# Patient Record
Sex: Male | Born: 1945 | Race: White | Hispanic: No | Smoking: Never smoker
Health system: Southern US, Community
[De-identification: ages and names within clinical notes are randomized; demographics above are authoritative.]

## PROBLEM LIST (undated history)

## (undated) DIAGNOSIS — E119 Type 2 diabetes mellitus without complications: Secondary | ICD-10-CM

## (undated) DIAGNOSIS — E7800 Pure hypercholesterolemia, unspecified: Secondary | ICD-10-CM

## (undated) DIAGNOSIS — I10 Essential (primary) hypertension: Secondary | ICD-10-CM

---

## 2010-10-10 ENCOUNTER — Ambulatory Visit: Payer: Self-pay | Admitting: Family Medicine

## 2011-02-17 ENCOUNTER — Ambulatory Visit: Payer: Self-pay | Admitting: Family Medicine

## 2012-11-27 ENCOUNTER — Ambulatory Visit: Payer: Self-pay | Admitting: Family Medicine

## 2015-02-26 ENCOUNTER — Encounter: Payer: Self-pay | Admitting: Emergency Medicine

## 2015-02-26 ENCOUNTER — Ambulatory Visit: Payer: BLUE CROSS/BLUE SHIELD

## 2015-02-26 ENCOUNTER — Ambulatory Visit
Admission: EM | Admit: 2015-02-26 | Discharge: 2015-02-26 | Disposition: A | Discharge status: Home / Self Care | Payer: BLUE CROSS/BLUE SHIELD | Attending: Family Medicine | Admitting: Family Medicine

## 2015-02-26 DIAGNOSIS — M2341 Loose body in knee, right knee: Secondary | ICD-10-CM

## 2015-02-26 DIAGNOSIS — M1711 Unilateral primary osteoarthritis, right knee: Secondary | ICD-10-CM | POA: Diagnosis not present

## 2015-02-26 DIAGNOSIS — I10 Essential (primary) hypertension: Secondary | ICD-10-CM | POA: Insufficient documentation

## 2015-02-26 DIAGNOSIS — M25761 Osteophyte, right knee: Secondary | ICD-10-CM | POA: Diagnosis not present

## 2015-02-26 DIAGNOSIS — E119 Type 2 diabetes mellitus without complications: Secondary | ICD-10-CM | POA: Insufficient documentation

## 2015-02-26 DIAGNOSIS — M25561 Pain in right knee: Secondary | ICD-10-CM | POA: Diagnosis present

## 2015-02-26 HISTORY — DX: Type 2 diabetes mellitus without complications: E11.9

## 2015-02-26 HISTORY — DX: Essential (primary) hypertension: I10

## 2015-02-26 MED ORDER — MELOXICAM 7.5 MG PO TABS: 7.5000 mg | ORAL_TABLET | Freq: Every day | ORAL | Status: DC

## 2015-02-26 NOTE — ED Provider Notes (Signed)
CSN: [id number]     Arrival date & time 02/26/15  1745 History   First MD Initiated Contact with Patient 02/26/15 1756     Chief Complaint  Patient presents with  . Leg Pain   (Consider location/radiation/quality/duration/timing/severity/associated sxs/prior Treatment) HPI  This a 69 year old male presents with a 2-3 month history of right knee pain he states will radiate into his anterior and into his dorsum foot. He states that happens with any motion with activity. This will also keep him up but late at night. When he first arises from a sitting or lying position and walks he limps for quite some time until his leg warms up" and then he can go about the day. He denies any back pain. He does not remember having any swelling in his knee. His had no injury to his knee.  Past Medical History  Diagnosis Date  . Diabetes mellitus without complication   . Hypertension    History reviewed. No pertinent past surgical history. Family History  Problem Relation Age of Onset  . Multiple sclerosis Mother   . Cerebrovascular Accident Mother   . Hypertension Father    Social History  Substance Use Topics  . Smoking status: Never Smoker   . Smokeless tobacco: None  . Alcohol Use: No    Review of Systems  Constitutional: Positive for activity change.  Musculoskeletal: Positive for arthralgias.  All other systems reviewed and are negative.   Allergies  Review of patient's allergies indicates no known allergies.  Home Medications   Prior to Admission medications   Medication Sig Start Date End Date Taking? Authorizing Provider  lisinopril-hydrochlorothiazide (PRINZIDE,ZESTORETIC) 20-12.5 MG per tablet Take 1 tablet by mouth daily.   Yes Historical Provider, MD  lovastatin (MEVACOR) 20 MG tablet Take 20 mg by mouth at bedtime.   Yes Historical Provider, MD  metFORMIN (GLUMETZA) 1000 MG (MOD) 24 hr tablet Take 1,000 mg by mouth daily with breakfast.   Yes Historical Provider, MD  meloxicam  (MOBIC) 7.5 MG tablet Take 1 tablet (7.5 mg total) by mouth daily. 02/26/15   Chrissie Noa Roemer, PA-C   BP 150/70 mmHg  Pulse 68  Temp(Src) 97.9 F (36.6 C) (Tympanic)  Resp 18  Ht  (1.651 m)  Wt 230 lb (104.327 kg)  BMI 38.27 kg/m2  SpO2 98% Physical Exam  Constitutional: He is oriented to person, place, and time. He appears well-developed and well-nourished.  HENT:  Head: Normocephalic and atraumatic.  Eyes: Pupils are equal, round, and reactive to light.  Musculoskeletal: He exhibits tenderness. He exhibits no edema.  Examination of the right knee is a very small joint effusion. He has difficulty attaining full extension and lacks approximately 5-10. Range of motion the sitting position is comfortable. There is no collateral ligament laxity or tenderness. He does have some mild joint line tenderness on the medial joint line. He does have some tenderness over the anterior tib particularly distally. Is no retropatellar tenderness and a negative grind test. He has a negative drawer sign.  Neurological: He is alert and oriented to person, place, and time.  Skin: Skin is dry.  Psychiatric: He has a normal mood and affect. His behavior is normal. Judgment and thought content normal.  Nursing note and vitals reviewed.   ED Course  Procedures (including critical care time) Labs Review Labs Reviewed - No data to display  Imaging Review Dg Knee Complete 4 Views Right  02/26/2015   CLINICAL DATA:  Pain in the right knee  from the anterior patella down to the foot. Pain with and without weight-bearing. No known injury appear  EXAM: RIGHT KNEE - COMPLETE 4+ VIEW  COMPARISON:  None.  FINDINGS: Degenerative changes in the right knee with medial compartment narrowing and mild tricompartmental osteophytosis. There is a small osseous fragment in the anterior midline joint space which may indicate a loose body. Donor site is not visualized. No significant effusion. No evidence of acute fracture or  dislocation.  IMPRESSION: Degenerative changes in the right knee. Possible anterior loose body.   Electronically Signed   By: Burman Nieves M.D.   On: 02/26/2015 18:36     MDM   1. Primary osteoarthritis of right knee   2. Loose body of knee, right    New Prescriptions   MELOXICAM (MOBIC) 7.5 MG TABLET    Take 1 tablet (7.5 mg total) by mouth daily.  Plan: 1. Test/x-ray results and diagnosis reviewed with patient 2. rx as per orders; risks, benefits, potential side effects reviewed with patient 3. Recommend supportive treatment with cane/crutch. Heat  4. F/u orthopaedics. I discussed the patient's x-ray results and my findings. Told him that he does have appears as a loose body in his joint along with some degenerative arthritis. I recommended he see an orthopedist for further evaluation and possible arthroscopy/total knee at his discretion. In the meantime I recommended he use crutches or a cane to help unload the joint and is also prescribed some Mobic 7.5 mg he'll take on a daily basis. Use Heat whenever the joint is very painful. Provide him with a disc of his x-rays.  Lutricia Feil, PA-C 02/26/15 1907

## 2015-02-26 NOTE — Discharge Instructions (Signed)

## 2015-02-26 NOTE — ED Notes (Signed)
Pt with right leg pain x 2 months

## 2015-03-12 DIAGNOSIS — M1711 Unilateral primary osteoarthritis, right knee: Secondary | ICD-10-CM | POA: Diagnosis not present

## 2016-06-01 ENCOUNTER — Ambulatory Visit
Admission: EM | Admit: 2016-06-01 | Discharge: 2016-06-01 | Disposition: A | Discharge status: Home / Self Care | Payer: BLUE CROSS/BLUE SHIELD | Attending: Emergency Medicine | Admitting: Emergency Medicine

## 2016-06-01 ENCOUNTER — Ambulatory Visit (INDEPENDENT_AMBULATORY_CARE_PROVIDER_SITE_OTHER): Payer: BLUE CROSS/BLUE SHIELD

## 2016-06-01 DIAGNOSIS — M25562 Pain in left knee: Secondary | ICD-10-CM

## 2016-06-01 DIAGNOSIS — R21 Rash and other nonspecific skin eruption: Secondary | ICD-10-CM

## 2016-06-01 MED ORDER — IBUPROFEN 600 MG PO TABS: 600.0000 mg | ORAL_TABLET | Freq: Four times a day (QID) | ORAL | 0 refills | Status: DC | PRN

## 2016-06-01 MED ORDER — TRAMADOL HCL 50 MG PO TABS: ORAL_TABLET | ORAL | 0 refills | Status: AC

## 2016-06-01 MED ORDER — MELOXICAM 7.5 MG PO TABS: 7.5000 mg | ORAL_TABLET | Freq: Every day | ORAL | 0 refills | Status: AC

## 2016-06-01 MED ORDER — TRIAMCINOLONE ACETONIDE 0.1 % EX CREA: 1.0000 "application " | TOPICAL_CREAM | Freq: Two times a day (BID) | CUTANEOUS | 0 refills | Status: AC

## 2016-06-01 NOTE — ED Triage Notes (Signed)
Pt c/o left knee pain, he was working today and noticed it was snapping and popping. He purchased a knee brace and it helped some but it still hurts when he put pressure on it.

## 2016-06-01 NOTE — ED Provider Notes (Signed)
HPI  SUBJECTIVE:  Darrell Lowe is a 70 y.o. male who presents with Diffuse left knee pain for the past 2 days. He reports popping or cracking while walking today. States that the pain is constant. He tried a knee brace with improvement in symptoms. Symptoms worse with walking, implying torque. He denies erythema, swelling, fevers, numbness, tingling, weakness, no fall, trauma or changes physical activity. He walks over 4 miles a day and is on his knees a lot at his job at [employer]. He also reports an itchy rash for the past 2-3 months. He has a past medical history diabetes, hypertension. No history of left knee injury, GI bleed, kidney disease. PMD: [facility]    Past Medical History:  Diagnosis Date  . Diabetes mellitus without complication ([facility])   . Hypertension     History reviewed. No pertinent surgical history.  Family History  Problem Relation Age of Onset  . Multiple sclerosis Mother   . Cerebrovascular Accident Mother   . Hypertension Father     Social History  Substance Use Topics  . Smoking status: Never Smoker  . Smokeless tobacco: Never Used  . Alcohol use No    No current facility-administered medications for this encounter.   Current Outpatient Prescriptions:  .  lisinopril-hydrochlorothiazide (PRINZIDE,ZESTORETIC) 20-12.5 MG per tablet, Take 1 tablet by mouth daily., Disp: , Rfl:  .  lovastatin (MEVACOR) 20 MG tablet, Take 20 mg by mouth at bedtime., Disp: , Rfl:  .  metFORMIN (GLUMETZA) 1000 MG (MOD) 24 hr tablet, Take 1,000 mg by mouth daily with breakfast., Disp: , Rfl:  .  meloxicam (MOBIC) 7.5 MG tablet, Take 1 tablet (7.5 mg total) by mouth daily., Disp: 15 tablet, Rfl: 0 .  traMADol (ULTRAM) 50 MG tablet, 1-2 tabs po q 6 hr prn pain Maximum dose= 8 tablets per day, Disp: 20 tablet, Rfl: 0 .  triamcinolone cream (KENALOG) 0.1 %, Apply 1 application topically 2 (two) times daily. Apply for 2 weeks. May use on face, Disp: 30 g, Rfl:  0  No Known Allergies   ROS  As noted in HPI.   Physical Exam  BP 119/62 (BP Location: Left Arm)   Pulse 67   Temp 98 F (36.7 C) (Oral)   Resp 18   Ht 5\' 5"  (1.651 m)   Wt 229 lb (103.9 kg)   SpO2 98%   BMI 38.11 kg/m   Constitutional: Well developed, well nourished, no acute distress Eyes:  EOMI, conjunctiva normal bilaterally HENT: Normocephalic, atraumatic,mucus membranes moist Respiratory: Normal inspiratory effort Cardiovascular: Normal rate GI: nondistended Skin: excoriations on his  upper extremities. Dry, chapped skin  Musculoskeletal: L Knee ROM baseline for PT , Flexion  intact, Patella NT, Patellar tendon NT, Medial joint minimally tender, Lateral joint NT, Popliteal region NT, Lachman's stable, Varus LCL stress testing stable, Valgus MCL stress testing stable, McMurray's testing normal but with some crepitus, distal NVI with intact baseline sensation / motor / pulse distal to knee affected extremity. No effusion. No erythema. No increased temperature. No popliteal tenderness. Neurologic: Alert & oriented x 3, no focal neuro deficits Psychiatric: Speech and behavior appropriate   ED Course   Medications - No data to display  Orders Placed This Encounter  Procedures  . DG Knee Complete 4 Views Left    Standing Status:   Standing    Number of Occurrences:   1    Order Specific Question:   Reason for Exam (SYMPTOM  OR  DIAGNOSIS REQUIRED)    Answer:   Pain    No results found for this or any previous visit (from the past 24 hour(s)). Dg Knee Complete 4 Views Left  Result Date: 06/01/2016 CLINICAL DATA:  Knee pain for several weeks.  No known injury. EXAM: LEFT KNEE - COMPLETE 4+ VIEW COMPARISON:  None FINDINGS: Spurring is noted of the tibial spine and patella. There is slight joint space narrowing of medial femorotibial compartment. No acute fracture, intra-articular loose body nor bone destruction. No significant joint effusion. Soft tissue varicosities  are noted along the medial aspect of the thigh, knee and calf. IMPRESSION: Osteophytes with medial femorotibial joint space narrowing consistent with osteoarthritis. Kellgren Lawrence Grade:  2 *grade 0: no radiographic features of OA are present *grade 1: doubtful joint space narrowing (JSN) and possible osteophytic lipping *grade 2: definite osteophytes and possible JSN on anteroposterior weight-bearing radiograph *grade 3: multiple osteophytes, definite JSN, sclerosis, possible bony deformity *grade 4: large osteophytes, marked JSN, severe sclerosis and definite bony deformity Electronically Signed   By: Tollie Ethavid  Kwon M.D.   On: 06/01/2016 17:43    ED Clinical Impression  Acute pain of left knee  Rash   ED Assessment/Plan  Reviewed  imaging independently. Osteophytes with some medial osteoarthritis. See radiology report for details   Knee: May be meniscal wear/tear no gout, infection. Home with meloxicam 7.5 mg per patient last tramadol, knee brace, ice, rest, follow-up with Dr. Lesleigh NoeKosinski, orthopedic surgeon on call in a week to 10 days if no better with conservative treatment.   Rash: May be eczema. We'll send home with Zyrtec, and Eucerin/Goldbond, triamcinolone cream as needed for itching.  Discussed imaging, MDM, plan and followup with patient. Discussed sn/sx that should prompt return to the ED. Patient agrees with plan.   Meds ordered this encounter  Medications  . DISCONTD: ibuprofen (ADVIL,MOTRIN) 600 MG tablet    Sig: Take 1 tablet (600 mg total) by mouth every 6 (six) hours as needed.    Dispense:  30 tablet    Refill:  0  . traMADol (ULTRAM) 50 MG tablet    Sig: 1-2 tabs po q 6 hr prn pain Maximum dose= 8 tablets per day    Dispense:  20 tablet    Refill:  0  . triamcinolone cream (KENALOG) 0.1 %    Sig: Apply 1 application topically 2 (two) times daily. Apply for 2 weeks. May use on face    Dispense:  30 g    Refill:  0  . meloxicam (MOBIC) 7.5 MG tablet    Sig: Take 1  tablet (7.5 mg total) by mouth daily.    Dispense:  15 tablet    Refill:  0    *This clinic note was created using Scientist, clinical (histocompatibility and immunogenetics)Dragon dictation software. Therefore, there may be occasional mistakes despite careful proofreading.  ?   Domenick GongAshley Lahari Suttles, MD 06/03/16 2006

## 2016-06-01 NOTE — Discharge Instructions (Signed)
Zyrtec and gold bond or eucerin for dry skin and itching.  Triamcinolone for itching.   Ice 20 minutes at a time.

## 2017-03-31 ENCOUNTER — Other Ambulatory Visit: Payer: Self-pay | Admitting: Orthopedic Surgery

## 2017-03-31 DIAGNOSIS — M1711 Unilateral primary osteoarthritis, right knee: Secondary | ICD-10-CM

## 2017-04-12 ENCOUNTER — Ambulatory Visit
Admission: RE | Admit: 2017-04-12 | Discharge: 2017-04-12 | Disposition: A | Discharge status: Home / Self Care | Payer: BLUE CROSS/BLUE SHIELD | Source: Ambulatory Visit | Attending: Orthopedic Surgery | Admitting: Orthopedic Surgery

## 2017-04-12 DIAGNOSIS — M1711 Unilateral primary osteoarthritis, right knee: Secondary | ICD-10-CM | POA: Insufficient documentation

## 2017-04-12 DIAGNOSIS — M84461A Pathological fracture, right tibia, initial encounter for fracture: Secondary | ICD-10-CM | POA: Insufficient documentation

## 2017-04-12 DIAGNOSIS — M25561 Pain in right knee: Secondary | ICD-10-CM | POA: Diagnosis not present

## 2017-04-12 DIAGNOSIS — S83231A Complex tear of medial meniscus, current injury, right knee, initial encounter: Secondary | ICD-10-CM | POA: Diagnosis not present

## 2017-04-12 DIAGNOSIS — X58XXXA Exposure to other specified factors, initial encounter: Secondary | ICD-10-CM | POA: Insufficient documentation

## 2017-05-04 DIAGNOSIS — Z6837 Body mass index (BMI) 37.0-37.9, adult: Secondary | ICD-10-CM | POA: Diagnosis not present

## 2017-05-04 DIAGNOSIS — Z8739 Personal history of other diseases of the musculoskeletal system and connective tissue: Secondary | ICD-10-CM | POA: Diagnosis not present

## 2017-05-04 DIAGNOSIS — E669 Obesity, unspecified: Secondary | ICD-10-CM | POA: Diagnosis not present

## 2017-05-04 DIAGNOSIS — M1712 Unilateral primary osteoarthritis, left knee: Secondary | ICD-10-CM | POA: Diagnosis not present

## 2017-05-04 DIAGNOSIS — E119 Type 2 diabetes mellitus without complications: Secondary | ICD-10-CM | POA: Diagnosis not present

## 2017-05-04 DIAGNOSIS — M1711 Unilateral primary osteoarthritis, right knee: Secondary | ICD-10-CM | POA: Diagnosis not present

## 2017-07-07 ENCOUNTER — Other Ambulatory Visit: Payer: Self-pay | Admitting: Family

## 2017-07-07 ENCOUNTER — Ambulatory Visit
Admission: RE | Admit: 2017-07-07 | Discharge: 2017-07-07 | Disposition: A | Discharge status: Home / Self Care | Payer: BLUE CROSS/BLUE SHIELD | Source: Ambulatory Visit | Attending: Family | Admitting: Family

## 2017-07-07 DIAGNOSIS — Z139 Encounter for screening, unspecified: Secondary | ICD-10-CM

## 2017-07-07 DIAGNOSIS — M25551 Pain in right hip: Secondary | ICD-10-CM

## 2017-07-07 DIAGNOSIS — M25552 Pain in left hip: Secondary | ICD-10-CM

## 2017-07-07 DIAGNOSIS — M25562 Pain in left knee: Secondary | ICD-10-CM

## 2017-07-07 DIAGNOSIS — M85862 Other specified disorders of bone density and structure, left lower leg: Secondary | ICD-10-CM | POA: Diagnosis not present

## 2017-07-07 DIAGNOSIS — M25561 Pain in right knee: Secondary | ICD-10-CM

## 2017-07-07 DIAGNOSIS — M47816 Spondylosis without myelopathy or radiculopathy, lumbar region: Secondary | ICD-10-CM | POA: Diagnosis not present

## 2017-07-07 DIAGNOSIS — M170 Bilateral primary osteoarthritis of knee: Secondary | ICD-10-CM | POA: Diagnosis not present

## 2017-07-07 DIAGNOSIS — M25559 Pain in unspecified hip: Secondary | ICD-10-CM | POA: Insufficient documentation

## 2017-07-07 DIAGNOSIS — M85861 Other specified disorders of bone density and structure, right lower leg: Secondary | ICD-10-CM | POA: Insufficient documentation

## 2019-10-23 ENCOUNTER — Ambulatory Visit
Admission: EM | Admit: 2019-10-23 | Discharge: 2019-10-23 | Disposition: A | Discharge status: Home / Self Care | Payer: Medicare Other

## 2019-10-23 ENCOUNTER — Other Ambulatory Visit: Payer: Self-pay

## 2019-10-23 DIAGNOSIS — L03213 Periorbital cellulitis: Secondary | ICD-10-CM

## 2019-10-23 DIAGNOSIS — H1032 Unspecified acute conjunctivitis, left eye: Secondary | ICD-10-CM

## 2019-10-23 HISTORY — DX: Pure hypercholesterolemia, unspecified: E78.00

## 2019-10-23 MED ORDER — POLYMYXIN B-TRIMETHOPRIM 10000-0.1 UNIT/ML-% OP SOLN: 2.0000 [drp] | OPHTHALMIC | 0 refills | Status: DC

## 2019-10-23 MED ORDER — SULFAMETHOXAZOLE-TRIMETHOPRIM 800-160 MG PO TABS: 1.0000 | ORAL_TABLET | Freq: Two times a day (BID) | ORAL | 0 refills | Status: AC

## 2019-10-23 NOTE — ED Provider Notes (Signed)
[place]   Name: Darrell Lowe DOB: September 22, 1945 MRN: [id number] CSN: [id number] PCP: [facility]  Arrival date and time:  10/23/19 0906  Chief Complaint:  Eye Problem (left)  NOTE: Prior to seeing the patient today, I have reviewed the triage nursing documentation and vital signs. Clinical staff has updated patient's PMH/PSHx, current medication list, and drug allergies/intolerances to ensure comprehensive history available to assist in medical decision making.   History:   HPI: Darrell Lowe is a 74 y.o. male who presents today with complaints of pain and redness in his and around LEFT eye that began 3 days ago. He denies any injury to the eye. Patient with excessive tearing. He has experienced yellow exudative crusting causing his eye to be matted shut when he wakes up in the morning. Vision is minimally blurred. Patient notes that eye irritation is not exacerbated by light (photophobia). He does not wear contact lenses. Patient has not tried any over the counter eye drops or pain medications to help reduce/relieve his current symptoms.   Past Medical History:  Diagnosis Date  . Diabetes mellitus without complication ([place])   . Hypercholesteremia   . Hypertension     History reviewed. No pertinent surgical history.  Family History  Problem Relation Age of Onset  . Multiple sclerosis Mother   . Cerebrovascular Accident Mother   . Hypertension Father     Social History   Tobacco Use  . Smoking status: Never Smoker  . Smokeless tobacco: Never Used  Substance Use Topics  . Alcohol use: No  . Drug use: Not on file    There are no problems to display for this patient.   Home Medications:    Current Meds  Medication Sig  . allopurinol (ZYLOPRIM) 300 MG tablet Take 1 tablet by mouth daily as needed.  Marland Kitchen lisinopril-hydrochlorothiazide (PRINZIDE,ZESTORETIC) 20-12.5 MG per tablet Take 1 tablet by mouth daily.  . meloxicam (MOBIC) 7.5 MG tablet Take  1 tablet (7.5 mg total) by mouth daily.  . metFORMIN (GLUMETZA) 1000 MG (MOD) 24 hr tablet Take 1,000 mg by mouth daily with breakfast.  . traMADol (ULTRAM) 50 MG tablet 1-2 tabs po q 6 hr prn pain Maximum dose= 8 tablets per day  . triamcinolone cream (KENALOG) 0.1 % Apply 1 application topically 2 (two) times daily. Apply for 2 weeks. May use on face    Allergies:   Patient has no known allergies.  Review of Systems (ROS):  Review of systems NEGATIVE unless otherwise noted in narrative H&P section.   Vital Signs: Today's Vitals   10/23/19 0926 10/23/19 0929 10/23/19 1009  BP:  114/65   Pulse:  79   Resp:  18   Temp:  98.3 F (36.8 C)   TempSrc:  Oral   SpO2:  98%   Weight: 219 lb (99.3 kg)    Height: 5\' 2"  (1.575 m)    PainSc: 0-No pain  0-No pain    Physical Exam: Physical Exam  Constitutional: He is oriented to person, place, and time and well-developed, well-nourished, and in no distress.  HENT:  Head: Normocephalic and atraumatic.  Eyes: Pupils are equal, round, and reactive to light. EOM are normal. Left eye exhibits discharge and exudate. Left conjunctiva is injected.  (+) erythematous edema to LEFT infraorbital area. Area TTP with no warmth.   Cardiovascular: Normal rate and intact distal pulses.  Pulmonary/Chest: Effort normal. No respiratory distress.  Lymphadenopathy:    He has no  cervical adenopathy.  Neurological: He is alert and oriented to person, place, and time. Gait normal.  Skin: Skin is warm and dry. No rash noted. He is not diaphoretic.  Psychiatric: Mood, memory, affect and judgment normal.  Nursing note and vitals reviewed.   Urgent Care Treatments / Results:   No orders of the defined types were placed in this encounter.   LABS: PLEASE NOTE: all labs that were ordered this encounter are listed, however only abnormal results are displayed. Labs Reviewed - No data to display  EKG: -None  RADIOLOGY: No results  found.  PROCEDURES: Procedures  MEDICATIONS RECEIVED THIS VISIT: Medications - No data to display  PERTINENT CLINICAL COURSE NOTES/UPDATES:   Initial Impression / Assessment and Plan / Urgent Care Course:  Pertinent labs & imaging results that were available during my care of the patient were personally reviewed by me and considered in my medical decision making (see lab/imaging section of note for values and interpretations).  Darrell Lowe is a 74 y.o. male who presents to [facility] today with complaints of Eye Problem (left)  Patient is well appearing overall in clinic today. He does not appear to be in any acute distress. Presenting symptoms (see HPI) and exam as documented above. Exam consistent with bacterial conjunctivitis with associated preseptal cellulitis of the LEFT eye. Will proceed with treatment as follows:   Polytrim ophthalmic gtts q4h x 5 days + SMZ-TMP DS x 5 day oral course.    Cool compresses to help soothe eye.    Avoid rubbing/touching eye to prevent transmission.   May use Tylenol and/or Ibuprofen as needed for discomfort.    Follow up with ophthalmology for worsening symptoms and/or vision changes   I have reviewed the follow up and strict return precautions for any new or worsening symptoms. Patient is aware of symptoms that would be deemed urgent/emergent, and would thus require further evaluation either here or in the emergency department. At the time of discharge, he verbalized understanding and consent with the discharge plan as it was reviewed with him. All questions were fielded by provider and/or clinic staff prior to patient discharge.    Final Clinical Impressions / Urgent Care Diagnoses:   Final diagnoses:  Acute bacterial conjunctivitis of left eye  Preseptal cellulitis of left eye    New Prescriptions:  [place] Controlled Substance Registry consulted? Not Applicable  Meds ordered this encounter  Medications  .  trimethoprim-polymyxin b (POLYTRIM) ophthalmic solution    Sig: Place 2 drops into the left eye every 4 (four) hours. X 5 days    Dispense:  10 mL    Refill:  0  . sulfamethoxazole-trimethoprim (BACTRIM DS) 800-160 MG tablet    Sig: Take 1 tablet by mouth 2 (two) times daily for 5 days.    Dispense:  10 tablet    Refill:  0    Recommended Follow up Care:  Patient encouraged to follow up with the following provider within the specified time frame, or sooner as dictated by the severity of his symptoms. As always, he was instructed that for any urgent/emergent care needs, he should seek care either here or in the emergency department for more immediate evaluation.  NOTE: This note was prepared using Scientist, clinical (histocompatibility and immunogenetics) along with smaller Lobbyist. Despite my best ability to proofread, there is the potential that transcriptional errors may still occur from this process, and are completely unintentional.    Verlee Monte, NP 10/23/19 2338

## 2019-10-23 NOTE — ED Triage Notes (Signed)
Pt presents with c/o possible conjunctivitis to the left eye. Pt states symptoms have been present for several days and are worsening. Pt denies itching/burning/pain but does state eye is uncomfortable, there is drainage present along with redness and swelling. Pt denies any visual changes.

## 2019-10-23 NOTE — Discharge Instructions (Addendum)
It was very nice seeing you today in clinic. Thank you for entrusting me with your care.   Keep eye clean and dry. Avoid rubbing and touching your eye. Use oral and topical antibiotics. Apply cool compresses. May use Tylenol and/or Ibuprofen as needed for discomfort. Monitor your vision. Any changes need to be evaluated by your eye doctor.   Make arrangements to follow up with your regular doctor in 1 week for re-evaluation if not improving. If your symptoms/condition worsens, please seek follow up care either here or in the ER. Please remember, our [facility] providers are "right here with you" when you need [place].   Again, it was my pleasure to take care of you today. Thank you for choosing our clinic. I hope that you start to feel better quickly.   Quentin Mulling, MSN, APRN, FNP-C, CEN Advanced Practice Provider [facility]

## 2020-05-13 ENCOUNTER — Encounter: Payer: Self-pay | Admitting: Emergency Medicine

## 2020-05-13 ENCOUNTER — Ambulatory Visit (INDEPENDENT_AMBULATORY_CARE_PROVIDER_SITE_OTHER): Payer: BC Managed Care – PPO

## 2020-05-13 ENCOUNTER — Ambulatory Visit
Admission: EM | Admit: 2020-05-13 | Discharge: 2020-05-13 | Disposition: A | Discharge status: Home / Self Care | Payer: BC Managed Care – PPO | Attending: Family Medicine | Admitting: Family Medicine

## 2020-05-13 ENCOUNTER — Other Ambulatory Visit: Payer: Self-pay

## 2020-05-13 DIAGNOSIS — W19XXXA Unspecified fall, initial encounter: Secondary | ICD-10-CM | POA: Diagnosis not present

## 2020-05-13 DIAGNOSIS — M79644 Pain in right finger(s): Secondary | ICD-10-CM | POA: Diagnosis not present

## 2020-05-13 DIAGNOSIS — M20031 Swan-neck deformity of right finger(s): Secondary | ICD-10-CM

## 2020-05-13 NOTE — ED Provider Notes (Signed)
[facility]    CSN: [id number] Arrival date & time: 05/13/20  1521      History   Chief Complaint Chief Complaint  Patient presents with  . Finger Injury    right ring finger   HPI  74 year old male presents with the above complaint.  Patient states that he suffered a fall 3 days ago.  He states that he stepped in a hole in the ground and subsequently fell.  He states that he has had no pain or significant swelling.  However, he has noticed that his right fourth finger is not normal.  He appears to have a swan-neck deformity.  He has been wearing a splint without resolution.  He reports slightly decreased range of motion with flexion.    Past Medical History:  Diagnosis Date  . Diabetes mellitus without complication (HCC)   . Hypercholesteremia   . Hypertension    Home Medications    Prior to Admission medications   Medication Sig Start Date End Date Taking? Authorizing Provider  allopurinol (ZYLOPRIM) 300 MG tablet Take 1 tablet by mouth daily as needed. 06/25/16  Yes [provider]  atorvastatin (LIPITOR) 80 MG tablet Take 80 mg by mouth daily. 07/24/19  Yes [provider]  lisinopril-hydrochlorothiazide (PRINZIDE,ZESTORETIC) 20-12.5 MG per tablet Take 1 tablet by mouth daily.   Yes [provider]  meloxicam (MOBIC) 7.5 MG tablet Take 1 tablet (7.5 mg total) by mouth daily. 06/01/16  Yes Domenick Gong, MD  metFORMIN (GLUMETZA) 1000 MG (MOD) 24 hr tablet Take 1,000 mg by mouth daily with breakfast.   Yes [provider]  traMADol (ULTRAM) 50 MG tablet 1-2 tabs po q 6 hr prn pain Maximum dose= 8 tablets per day 06/01/16  Yes Domenick Gong, MD  triamcinolone cream (KENALOG) 0.1 % Apply 1 application topically 2 (two) times daily. Apply for 2 weeks. May use on face 06/01/16   Domenick Gong, MD  lovastatin (MEVACOR) 20 MG tablet Take 20 mg by mouth at bedtime.  10/23/19  [provider]    Family  History Family History  Problem Relation Age of Onset  . Multiple sclerosis Mother   . Cerebrovascular Accident Mother   . Hypertension Father     Social History Social History   Tobacco Use  . Smoking status: Never Smoker  . Smokeless tobacco: Never Used  Vaping Use  . Vaping Use: Never used  Substance Use Topics  . Alcohol use: No  . Drug use: Not on file     Allergies   Patient has no known allergies.   Review of Systems Review of Systems  Musculoskeletal:       Finger injury.   Physical Exam Triage Vital Signs ED Triage Vitals  Enc Vitals Group     BP 05/13/20 1541 134/62     Pulse Rate 05/13/20 1541 66     Resp 05/13/20 1541 18     Temp 05/13/20 1541 98.2 F (36.8 C)     Temp Source 05/13/20 1541 Oral     SpO2 05/13/20 1541 96 %     Weight 05/13/20 1538 218 lb 14.7 oz (99.3 kg)     Height 05/13/20 1538 5\' 2"  (1.575 m)     Head Circumference --      Peak Flow --      Pain Score 05/13/20 1538 0     Pain Loc --      Pain Edu? --      Excl. in GC? --  Updated Vital Signs BP 134/62 (BP Location: Right Arm)   Pulse 66   Temp 98.2 F (36.8 C) (Oral)   Resp 18   Ht 5\' 2"  (1.575 m)   Wt 99.3 kg   SpO2 96%   BMI 40.04 kg/m   Visual Acuity Right Eye Distance:   Left Eye Distance:   Bilateral Distance:    Right Eye Near:   Left Eye Near:    Bilateral Near:     Physical Exam Vitals and nursing note reviewed.  Constitutional:      General: He is not in acute distress.    Appearance: He is obese.  HENT:     Head: Normocephalic and atraumatic.  Eyes:     General:        Right eye: No discharge.        Left eye: No discharge.     Conjunctiva/sclera: Conjunctivae normal.  Cardiovascular:     Rate and Rhythm: Normal rate and regular rhythm.  Pulmonary:     Effort: Pulmonary effort is normal.     Breath sounds: Normal breath sounds.  Musculoskeletal:     Comments: Swan-neck deformity noted of the right fourth digit.  Neurological:      Mental Status: He is alert.  Psychiatric:        Mood and Affect: Mood normal.        Behavior: Behavior normal.    UC Treatments / Results  Labs (all labs ordered are listed, but only abnormal results are displayed) Labs Reviewed - No data to display  EKG   Radiology DG Finger Ring Right  Result Date: 05/13/2020 CLINICAL DATA:  Pain following fall EXAM: RIGHT FOURTH FINGER 2+V COMPARISON:  None. FINDINGS: Frontal, oblique, and lateral views were obtained. There is soft tissue swelling. No evident fracture or dislocation. No appreciable joint space narrowing or erosion. IMPRESSION: Diffuse soft tissue swelling. No evident fracture or dislocation. No appreciable arthropathic change. Electronically Signed   By: 13/03/2020 III M.D.   On: 05/13/2020 16:04    Procedures Procedures (including critical care time)  Medications Ordered in UC Medications - No data to display  Initial Impression / Assessment and Plan / UC Course  I have reviewed the triage vital signs and the nursing notes.  Pertinent labs & imaging results that were available during my care of the patient were reviewed by me and considered in my medical decision making (see chart for details).    74 year old male presents with a swan-neck deformity of the right hand.  X-ray revealed no fracture or arthritic change.  Placed in splint.  Advised that he needs to see hand surgery for further evaluation and management.  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  Please call [facility] [phone]) for an appt with Dr. ([phone].  Take care  Dr. Stephenie Acres     ED Prescriptions    None     PDMP not reviewed this encounter.   Adriana Simas, Tommie Sams 05/13/20 1717

## 2020-05-13 NOTE — ED Triage Notes (Signed)
Pt c/o right ring finger injury. Pt states he fell and jammed his finger in the ground. incident occurred about 3 days ago. Finger is obviously deformed. He states he put a splint on it but has not helped. Denies decreased ROM.

## 2020-05-13 NOTE — Discharge Instructions (Signed)
Wear splint.  Please call [facility] [phone]) for an appt with Dr. Stephenie Acres.  Take care  Dr. Adriana Simas

## 2020-11-17 ENCOUNTER — Other Ambulatory Visit: Payer: Self-pay

## 2020-11-17 ENCOUNTER — Ambulatory Visit
Admission: EM | Admit: 2020-11-17 | Discharge: 2020-11-17 | Disposition: A | Discharge status: Home / Self Care | Payer: BC Managed Care – PPO

## 2020-11-17 DIAGNOSIS — M7661 Achilles tendinitis, right leg: Secondary | ICD-10-CM | POA: Diagnosis not present

## 2020-11-17 NOTE — Discharge Instructions (Addendum)
Use over-the-counter topical Voltaren gel 4 times a day to your right heel for pain and inflammation.  Follow the rehabilitation exercises in your discharge paperwork.  If your pain and swelling do not improve, or resolve, follow-up with Dedra Skeens for further evaluation.

## 2020-11-17 NOTE — ED Provider Notes (Signed)
[facility]    CSN: [id number] Arrival date & time: 11/17/20  1528      History   Chief Complaint Chief Complaint  Patient presents with  . Foot Pain    HPI Darrell Lowe is a 75 y.o. male.   HPI   75 year old male here for evaluation of right heel pain.  Patient reports that he has had a knot on his right heel for the last 4 days.  He states that it causes him significant pain when he tries to walk and he has been using over-the-counter Tiger balm topically with some relief.  The swelling is right where his Achilles meets his heel.  Patient has swelling in both of his feet and he reports that this is a frequent occurrence for him.  He reports that his swelling is currently improving.  Past Medical History:  Diagnosis Date  . Diabetes mellitus without complication (HCC)   . Hypercholesteremia   . Hypertension     There are no problems to display for this patient.   History reviewed. No pertinent surgical history.     Home Medications    Prior to Admission medications   Medication Sig Start Date End Date Taking? Authorizing Provider  allopurinol (ZYLOPRIM) 300 MG tablet Take 1 tablet by mouth daily as needed. 06/25/16  Yes [provider]  atorvastatin (LIPITOR) 80 MG tablet Take 80 mg by mouth daily. 07/24/19  Yes [provider]  lisinopril-hydrochlorothiazide (PRINZIDE,ZESTORETIC) 20-12.5 MG per tablet Take 1 tablet by mouth daily.   Yes [provider]  meloxicam (MOBIC) 7.5 MG tablet Take 1 tablet (7.5 mg total) by mouth daily. 06/01/16  Yes Domenick Gong, MD  metFORMIN (GLUMETZA) 1000 MG (MOD) 24 hr tablet Take 1,000 mg by mouth daily with breakfast.   Yes [provider]  traMADol (ULTRAM) 50 MG tablet 1-2 tabs po q 6 hr prn pain Maximum dose= 8 tablets per day 06/01/16  Yes Domenick Gong, MD  triamcinolone cream (KENALOG) 0.1 % Apply 1 application topically 2 (two) times daily. Apply for 2 weeks. May  use on face 06/01/16  Yes Domenick Gong, MD  lovastatin (MEVACOR) 20 MG tablet Take 20 mg by mouth at bedtime.  10/23/19  [provider]    Family History Family History  Problem Relation Age of Onset  . Multiple sclerosis Mother   . Cerebrovascular Accident Mother   . Hypertension Father     Social History Social History   Tobacco Use  . Smoking status: Never Smoker  . Smokeless tobacco: Never Used  Vaping Use  . Vaping Use: Never used  Substance Use Topics  . Alcohol use: No     Allergies   Patient has no known allergies.   Review of Systems Review of Systems  Constitutional: Negative for activity change and appetite change.  Musculoskeletal: Positive for arthralgias and joint swelling.  Skin: Positive for color change.  Hematological: Negative.   Psychiatric/Behavioral: Negative.      Physical Exam Triage Vital Signs ED Triage Vitals  Enc Vitals Group     BP 11/17/20 1548 135/79     Pulse Rate 11/17/20 1548 61     Resp 11/17/20 1548 16     Temp 11/17/20 1548 98.2 F (36.8 C)     Temp Source 11/17/20 1548 Oral     SpO2 11/17/20 1548 97 %     Weight 11/17/20 1547 213 lb (96.6 kg)     Height --  Head Circumference --      Peak Flow --      Pain Score 11/17/20 1547 6     Pain Loc --      Pain Edu? --      Excl. in GC? --    No data found.  Updated Vital Signs BP 135/79 (BP Location: Right Arm)   Pulse 61   Temp 98.2 F (36.8 C) (Oral)   Resp 16   Wt 213 lb (96.6 kg)   SpO2 97%   BMI 38.96 kg/m   Visual Acuity Right Eye Distance:   Left Eye Distance:   Bilateral Distance:    Right Eye Near:   Left Eye Near:    Bilateral Near:     Physical Exam Vitals and nursing note reviewed.  Constitutional:      Appearance: Normal appearance.  HENT:     Head: Normocephalic and atraumatic.  Musculoskeletal:        General: Swelling and tenderness present. No deformity.     Right lower leg: Edema present.  Skin:    General:  Skin is warm and dry.     Findings: Erythema present.  Neurological:     General: No focal deficit present.     Mental Status: He is alert and oriented to person, place, and time.  Psychiatric:        Mood and Affect: Mood normal.        Behavior: Behavior normal.        Thought Content: Thought content normal.        Judgment: Judgment normal.      UC Treatments / Results  Labs (all labs ordered are listed, but only abnormal results are displayed) Labs Reviewed - No data to display  EKG   Radiology No results found.  Procedures Procedures (including critical care time)  Medications Ordered in UC Medications - No data to display  Initial Impression / Assessment and Plan / UC Course  I have reviewed the triage vital signs and the nursing notes.  Pertinent labs & imaging results that were available during my care of the patient were reviewed by me and considered in my medical decision making (see chart for details).   Patient is a very pleasant 75 year old male here for evaluation of pain and swelling to his right heel that is been going for last 4 to 5 days and is not the result of an injury.  Patient reports that the pain and swelling is significant in causes him to have to walk with his foot at an angle as putting his foot ankle to range of motion increases the pain.  Physical exam reveals swelling at the insertion of the Achilles tendon into the right calcaneus.  The area is tender to touch but is free of erythema or ecchymosis.  Patient has full range of motion actively and against resistance to dorsiflexion and plantarflexion of his right ankle.  Patient is DP and PT pulses are 2+.  Patient does have 1+ pitting edema in both lower extremities from ankle to toes with cap refill of 2 to 3 seconds.  Patient has a history of diabetes and states that his legs frequently swell like this which he manages with elevation.  Patient's exam is consistent with insertional Achilles  tendinitis.  Will treat with over-the-counter Voltaren gel 4 times daily as well as home physical therapy.  Patient has an appointment scheduled to see Dedra Skeens over [facility] later this week.  Final Clinical Impressions(s) / UC Diagnoses   Final diagnoses:  Achilles tendinitis of right lower extremity     Discharge Instructions     Use over-the-counter topical Voltaren gel 4 times a day to your right heel for pain and inflammation.  Follow the rehabilitation exercises in your discharge paperwork.  If your pain and swelling do not improve, or resolve, follow-up with Dedra Skeens for further evaluation.    ED Prescriptions    None     PDMP not reviewed this encounter.   Becky Augusta, NP 11/17/20 [phone]

## 2020-11-17 NOTE — ED Triage Notes (Signed)
Pt c/o right heel pain for the last 4 days. States it feels like a knot.

## 2021-07-06 ENCOUNTER — Encounter: Payer: Self-pay | Admitting: Emergency Medicine

## 2021-07-06 ENCOUNTER — Ambulatory Visit (INDEPENDENT_AMBULATORY_CARE_PROVIDER_SITE_OTHER): Payer: BC Managed Care – PPO

## 2021-07-06 ENCOUNTER — Other Ambulatory Visit: Payer: Self-pay

## 2021-07-06 ENCOUNTER — Ambulatory Visit
Admission: EM | Admit: 2021-07-06 | Discharge: 2021-07-06 | Disposition: A | Discharge status: Home / Self Care | Payer: BC Managed Care – PPO | Attending: Emergency Medicine | Admitting: Emergency Medicine

## 2021-07-06 DIAGNOSIS — M25561 Pain in right knee: Secondary | ICD-10-CM

## 2021-07-06 DIAGNOSIS — M79601 Pain in right arm: Secondary | ICD-10-CM | POA: Diagnosis not present

## 2021-07-06 DIAGNOSIS — W19XXXA Unspecified fall, initial encounter: Secondary | ICD-10-CM | POA: Diagnosis not present

## 2021-07-06 DIAGNOSIS — S42294A Other nondisplaced fracture of upper end of right humerus, initial encounter for closed fracture: Secondary | ICD-10-CM

## 2021-07-06 NOTE — Discharge Instructions (Addendum)
You will need to follow-up with your orthopedic this week. Keep your right arm in the sling only remove it to shower and change close.  Make sure to keep the arm elevated. Take Tylenol as needed for pain You can use heat as needed for pain or swelling.
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
## 2023-08-25 IMAGING — CR DG HUMERUS 2V *R*
2 series · 2 of 2 positions shown · non-contrast
Comparison: None.

CLINICAL DATA: Right arm pain after fall.

EXAM:
RIGHT HUMERUS - 2+ VIEW

[humerus ap]
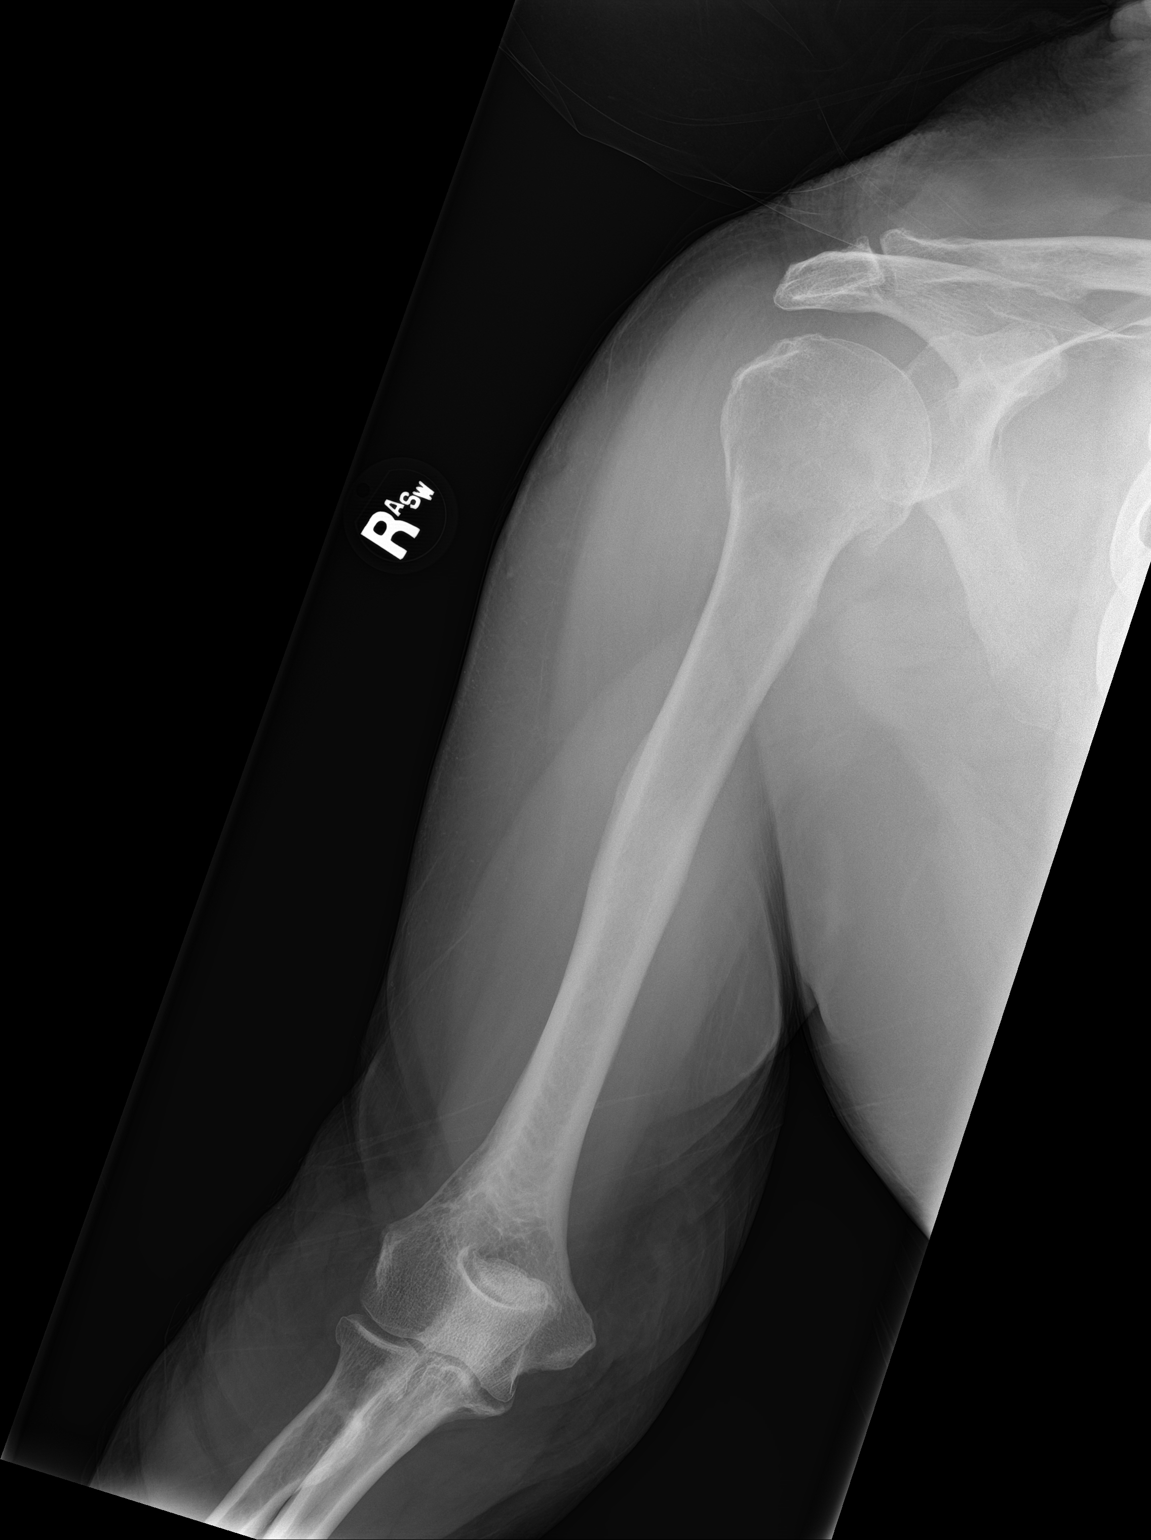

[humerus lat]
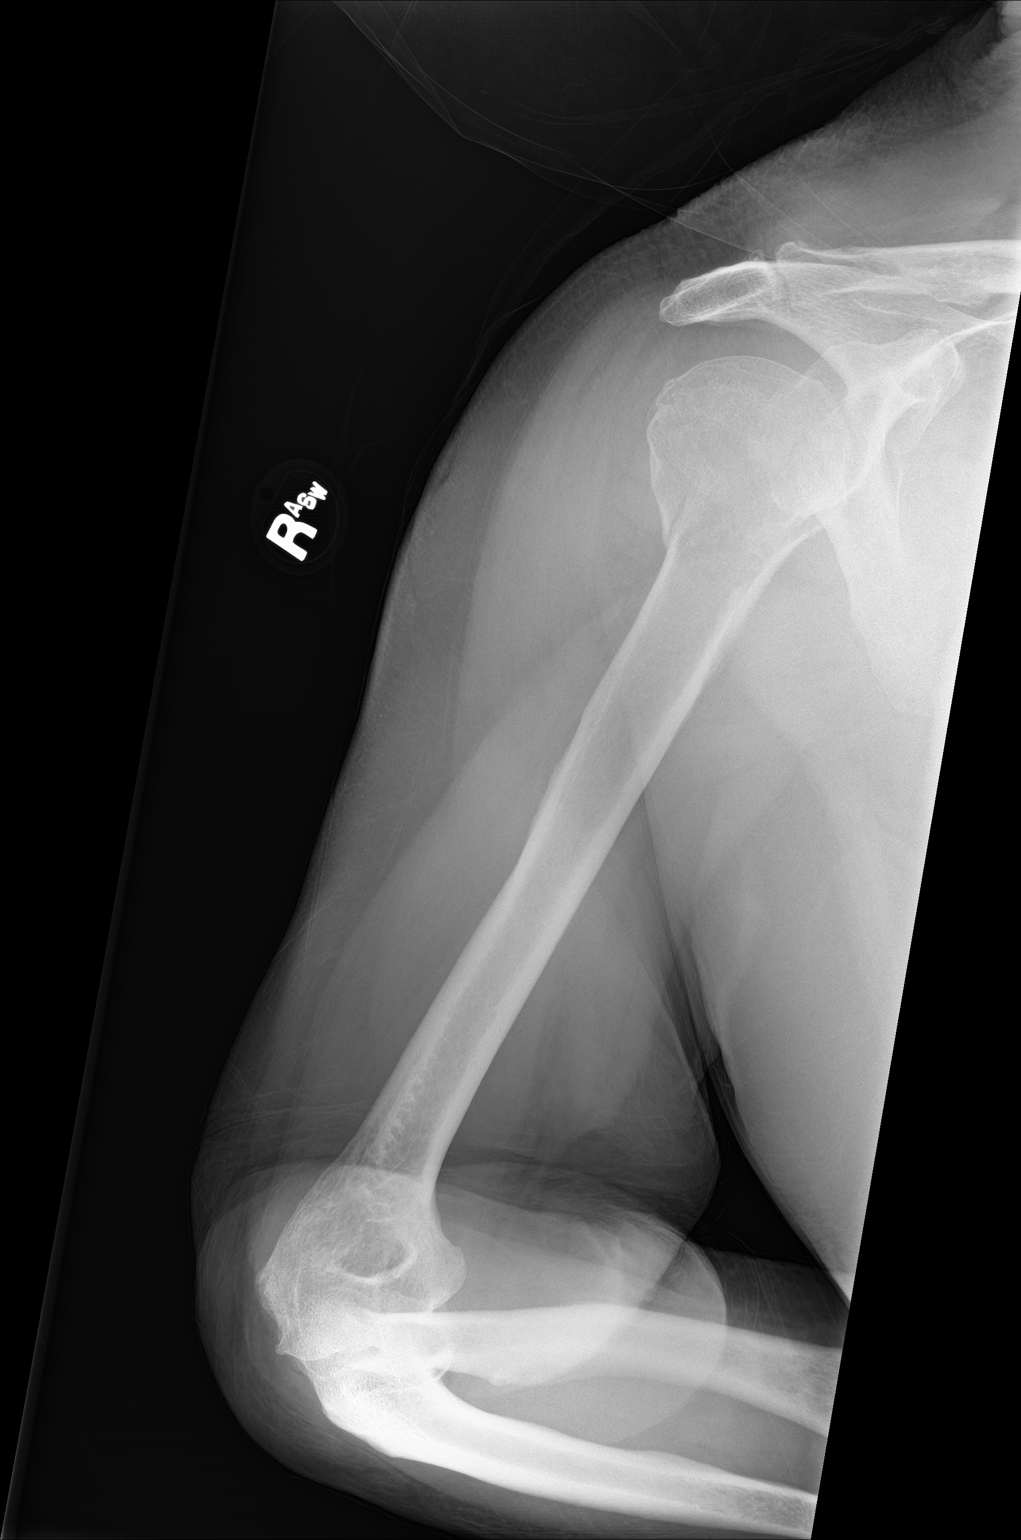

[2 of 2 positions shown; findings below may reference images not displayed]

FINDINGS: Mild deformity is seen involving the proximal right humeral neck
suggesting either acute or old healed fracture. CT scan may be
performed for further evaluation.
IMPRESSION: Mild deformity involving proximal right humeral neck concerning for
either acute or old healed fracture. CT scan may be performed for
further evaluation.
